# Patient Record
Sex: Female | Born: 1965 | Hispanic: Yes | Marital: Single | Smoking: Never smoker
Health system: Southern US, Community
[De-identification: ages and names within clinical notes are randomized; demographics above are authoritative.]

## PROBLEM LIST (undated history)

## (undated) DIAGNOSIS — I10 Essential (primary) hypertension: Secondary | ICD-10-CM

## (undated) HISTORY — PX: TUBAL LIGATION: SHX77

---

## 2006-07-07 ENCOUNTER — Ambulatory Visit: Payer: Self-pay | Admitting: Family Medicine

## 2006-07-08 ENCOUNTER — Ambulatory Visit: Payer: Self-pay | Admitting: *Deleted

## 2006-09-22 ENCOUNTER — Ambulatory Visit: Payer: Self-pay | Admitting: Family Medicine

## 2006-12-07 ENCOUNTER — Inpatient Hospital Stay (HOSPITAL_COMMUNITY): Admission: AD | Admit: 2006-12-07 | Discharge: 2006-12-07 | Payer: Self-pay | Admitting: Gynecology

## 2006-12-09 ENCOUNTER — Ambulatory Visit: Payer: Self-pay | Admitting: Family Medicine

## 2006-12-30 ENCOUNTER — Ambulatory Visit: Payer: Self-pay | Admitting: Family Medicine

## 2007-01-22 ENCOUNTER — Encounter (INDEPENDENT_AMBULATORY_CARE_PROVIDER_SITE_OTHER): Payer: Self-pay | Admitting: Family Medicine

## 2007-01-22 ENCOUNTER — Ambulatory Visit: Payer: Self-pay | Admitting: Family Medicine

## 2007-05-24 ENCOUNTER — Ambulatory Visit: Payer: Self-pay | Admitting: Family Medicine

## 2007-07-14 ENCOUNTER — Ambulatory Visit (HOSPITAL_COMMUNITY): Admission: RE | Admit: 2007-07-14 | Discharge: 2007-07-14 | Payer: Self-pay | Admitting: Family Medicine

## 2007-08-11 ENCOUNTER — Encounter (INDEPENDENT_AMBULATORY_CARE_PROVIDER_SITE_OTHER): Payer: Self-pay | Admitting: *Deleted

## 2008-02-14 ENCOUNTER — Ambulatory Visit: Payer: Self-pay | Admitting: Family Medicine

## 2008-02-14 LAB — CONVERTED CEMR LAB
BUN: 11 mg/dL (ref 6–23)
CO2: 27 meq/L (ref 19–32)
Calcium: 9.5 mg/dL (ref 8.4–10.5)
Chloride: 104 meq/L (ref 96–112)
Cholesterol: 160 mg/dL (ref 0–200)
Creatinine, Ser: 0.55 mg/dL (ref 0.40–1.20)
Free T4: 1.06 ng/dL (ref 0.89–1.80)
Glucose, Bld: 127 mg/dL — ABNORMAL HIGH (ref 70–99)
HDL: 53 mg/dL (ref >39)
LDL Cholesterol: 86 mg/dL (ref 0–99)
Potassium: 4 meq/L (ref 3.5–5.3)
Sodium: 142 meq/L (ref 135–145)
TSH: 1.336 microintl units/mL (ref 0.350–5.50)
Total CHOL/HDL Ratio: 3
Triglycerides: 107 mg/dL (ref <150)
VLDL: 21 mg/dL (ref 0–40)

## 2008-02-16 ENCOUNTER — Ambulatory Visit (HOSPITAL_COMMUNITY): Admission: RE | Admit: 2008-02-16 | Discharge: 2008-02-16 | Payer: Self-pay | Admitting: Family Medicine

## 2008-03-09 ENCOUNTER — Encounter: Admission: RE | Admit: 2008-03-09 | Discharge: 2008-03-09 | Payer: Self-pay | Admitting: Family Medicine

## 2008-04-14 ENCOUNTER — Ambulatory Visit: Payer: Self-pay | Admitting: Family Medicine

## 2008-04-18 ENCOUNTER — Ambulatory Visit: Payer: Self-pay | Admitting: Internal Medicine

## 2008-04-27 ENCOUNTER — Ambulatory Visit: Payer: Self-pay | Admitting: Internal Medicine

## 2008-07-06 ENCOUNTER — Ambulatory Visit: Payer: Self-pay | Admitting: Internal Medicine

## 2008-11-10 ENCOUNTER — Ambulatory Visit: Payer: Self-pay | Admitting: Family Medicine

## 2008-11-10 LAB — CONVERTED CEMR LAB: Microalb, Ur: 0.29 mg/dL (ref 0.00–1.89)

## 2008-11-14 ENCOUNTER — Ambulatory Visit: Payer: Self-pay | Admitting: Family Medicine

## 2009-02-01 ENCOUNTER — Ambulatory Visit: Payer: Self-pay | Admitting: Internal Medicine

## 2009-03-13 ENCOUNTER — Ambulatory Visit: Payer: Self-pay | Admitting: Family Medicine

## 2009-05-31 ENCOUNTER — Ambulatory Visit: Payer: Self-pay | Admitting: Family Medicine

## 2009-08-16 ENCOUNTER — Ambulatory Visit: Payer: Self-pay | Admitting: Internal Medicine

## 2009-11-01 ENCOUNTER — Ambulatory Visit: Payer: Self-pay | Admitting: Family Medicine

## 2009-11-01 LAB — CONVERTED CEMR LAB: Microalb, Ur: 0.58 mg/dL (ref 0.00–1.89)

## 2009-11-13 ENCOUNTER — Ambulatory Visit: Payer: Self-pay | Admitting: Family Medicine

## 2009-11-13 LAB — CONVERTED CEMR LAB
ALT: 13 units/L (ref 0–35)
AST: 15 units/L (ref 0–37)
Albumin: 4.1 g/dL (ref 3.5–5.2)
Alkaline Phosphatase: 90 units/L (ref 39–117)
BUN: 14 mg/dL (ref 6–23)
CO2: 24 meq/L (ref 19–32)
Calcium: 9 mg/dL (ref 8.4–10.5)
Chloride: 105 meq/L (ref 96–112)
Cholesterol: 170 mg/dL (ref 0–200)
Creatinine, Ser: 0.54 mg/dL (ref 0.40–1.20)
Glucose, Bld: 134 mg/dL — ABNORMAL HIGH (ref 70–99)
HDL: 48 mg/dL (ref >39)
LDL Cholesterol: 100 mg/dL — ABNORMAL HIGH (ref 0–99)
Potassium: 4 meq/L (ref 3.5–5.3)
Sodium: 140 meq/L (ref 135–145)
Total Bilirubin: 0.4 mg/dL (ref 0.3–1.2)
Total CHOL/HDL Ratio: 3.5
Total Protein: 7.2 g/dL (ref 6.0–8.3)
Triglycerides: 112 mg/dL (ref <150)
VLDL: 22 mg/dL (ref 0–40)
Vit D, 25-Hydroxy: 21 ng/mL — ABNORMAL LOW (ref 30–89)

## 2009-12-27 ENCOUNTER — Ambulatory Visit: Payer: Self-pay | Admitting: Family Medicine

## 2010-01-10 ENCOUNTER — Ambulatory Visit: Payer: Self-pay | Admitting: Family Medicine

## 2010-03-01 ENCOUNTER — Ambulatory Visit: Payer: Self-pay | Admitting: Internal Medicine

## 2010-03-05 ENCOUNTER — Ambulatory Visit (HOSPITAL_COMMUNITY): Admission: RE | Admit: 2010-03-05 | Discharge: 2010-03-05 | Payer: Self-pay | Admitting: Family Medicine

## 2010-08-06 ENCOUNTER — Ambulatory Visit: Payer: Self-pay | Admitting: Internal Medicine

## 2010-08-20 ENCOUNTER — Ambulatory Visit (HOSPITAL_COMMUNITY): Admission: RE | Admit: 2010-08-20 | Discharge: 2010-08-20 | Payer: Self-pay | Admitting: Family Medicine

## 2010-12-01 ENCOUNTER — Emergency Department (HOSPITAL_COMMUNITY)
Admission: EM | Admit: 2010-12-01 | Discharge: 2010-12-01 | Payer: Self-pay | Source: Home / Self Care | Admitting: Emergency Medicine

## 2010-12-09 LAB — GLUCOSE, CAPILLARY: Glucose-Capillary: 193 mg/dL — ABNORMAL HIGH (ref 70–99)

## 2011-12-19 ENCOUNTER — Other Ambulatory Visit: Payer: Self-pay | Admitting: Family Medicine

## 2012-06-17 ENCOUNTER — Encounter (HOSPITAL_COMMUNITY): Payer: Self-pay

## 2012-06-17 ENCOUNTER — Emergency Department (HOSPITAL_COMMUNITY)
Admission: EM | Admit: 2012-06-17 | Discharge: 2012-06-17 | Disposition: A | Discharge status: Home / Self Care | Payer: Self-pay | Source: Home / Self Care

## 2012-06-17 DIAGNOSIS — L0291 Cutaneous abscess, unspecified: Secondary | ICD-10-CM

## 2012-06-17 HISTORY — DX: Essential (primary) hypertension: I10

## 2012-06-17 LAB — POCT I-STAT, CHEM 8
BUN: 12 mg/dL (ref 6–23)
Calcium, Ion: 1.24 mmol/L — ABNORMAL HIGH (ref 1.12–1.23)
Chloride: 103 mEq/L (ref 96–112)
Creatinine, Ser: 0.7 mg/dL (ref 0.50–1.10)
Glucose, Bld: 172 mg/dL — ABNORMAL HIGH (ref 70–99)
HCT: 41 % (ref 36.0–46.0)
Hemoglobin: 13.9 g/dL (ref 12.0–15.0)
Potassium: 4 mEq/L (ref 3.5–5.1)
Sodium: 142 mEq/L (ref 135–145)
TCO2: 28 mmol/L (ref 0–100)

## 2012-06-17 MED ORDER — DOXYCYCLINE HYCLATE 100 MG PO TABS: 100.0000 mg | ORAL_TABLET | Freq: Two times a day (BID) | ORAL | Status: AC

## 2012-06-17 NOTE — ED Notes (Signed)
After discharge, family member accompanying pt asked for the note to be changed from 2 days off to 10-12 days off.  Informed her we could not do that.  Pt and family member went to front desk where [employer] and Ar[employer]a Jansky witnessed them scratching through the return to work date with ink pen and re-writing a new date.

## 2012-06-17 NOTE — ED Provider Notes (Signed)
History     CSN: [id number]  Arrival date & time 06/17/12  1430   First MD Initiated Contact with Patient 06/17/12 1434      Chief Complaint  Patient presents with  . Recurrent Skin Infections   HPI  Patient presents to Urgent Care for an abscess.  Daughter interprets for patient.  Patient first noticed the abscess 6 days ago.  It started as a small boil but now has become larger, more painful, red, and swollen.  Located on skin LT lower abdomen.  Patient has a history of recurrent infections.  She does have Diabetes, Type 2 and takes Metformin 500 mg BID.  She does not check CBG at home.  Patient complains of 10/10.  She has not taken any medication for pain.  She endorses chills and subjective fevers at home.  Denies any nausea or vomiting.   Past Medical History  Diagnosis Date  . Hypertension   . Diabetes mellitus     History reviewed. No pertinent past surgical history.  No family history on file.  History  Substance Use Topics  . Smoking status: Never Smoker   . Smokeless tobacco: Not on file  . Alcohol Use: No    Review of Systems  Per HPI  Allergies  Review of patient's allergies indicates no known allergies.  Home Medications   Current Outpatient Rx  Name Route Sig Dispense Refill  . IBUPROFEN 800 MG PO TABS Oral Take 800 mg by mouth every 8 (eight) hours as needed.    Marland Kitchen LISINOPRIL 5 MG PO TABS Oral Take 5 mg by mouth 2 (two) times daily.    Marland Kitchen METFORMIN HCL 500 MG PO TABS Oral Take 500 mg by mouth 2 (two) times daily with a meal.    . THERA M PLUS PO TABS Oral Take 1 tablet by mouth daily.    Marland Kitchen PANTOPRAZOLE SODIUM 40 MG PO TBEC Oral Take 40 mg by mouth daily.      BP 127/65  Pulse 80  Temp 98.6 F (37 C) (Oral)  Resp 16  SpO2 100%  LMP 04/07/2012  Physical Exam  Constitutional: No distress.  Cardiovascular: Normal rate and normal heart sounds.   Pulmonary/Chest: Effort normal and breath sounds normal.  Skin:       Oval-shaped area of erythema  and induration on left side of lower abdomen; open center with dried pus but no active bleeding or drainage; tender on palpation; measures about 4-5 cm long, 4 cm wide.    ED Course  Procedures (including critical care time)  Labs Reviewed  POCT I-STAT, CHEM 8 - Abnormal; Notable for the following:    Glucose, Bld 172 (*)     Calcium, Ion 1.24 (*)     All other components within normal limits     MDM   For cellulitis, will treat with Doxycycline BID x 10 days.  Will clean open lesion with chlorhexidine scrub.  Patient to return to ED in 3 days if cellulitis is worsening or if she develops worsening pain, fever, chills, or nausea vomiting.  Patient to follow up with PCP for diabetes - CBG 172 today.          Barnabas Lister, MD 06/17/12 [phone]

## 2012-06-17 NOTE — ED Notes (Signed)
C/o boil to lt lower abdomen for 4 days.  States it started draining yesterday.  Has had previous boils.

## 2012-06-17 NOTE — ED Provider Notes (Signed)
Medical screening examination/treatment/procedure(s) were performed by resident-physician practitioner and as supervising physician I was immediately available for consultation/collaboration. I have examined the patient and review discharge instructions with the patient.  Jill Beck   Cleavon Goldman, MD 06/17/12 1613 

## 2012-12-21 ENCOUNTER — Encounter (HOSPITAL_COMMUNITY): Payer: Self-pay | Admitting: *Deleted

## 2012-12-21 ENCOUNTER — Emergency Department (INDEPENDENT_AMBULATORY_CARE_PROVIDER_SITE_OTHER)
Admission: EM | Admit: 2012-12-21 | Discharge: 2012-12-21 | Disposition: A | Discharge status: Home / Self Care | Payer: No Typology Code available for payment source | Source: Home / Self Care | Attending: Emergency Medicine | Admitting: Emergency Medicine

## 2012-12-21 DIAGNOSIS — S39012A Strain of muscle, fascia and tendon of lower back, initial encounter: Secondary | ICD-10-CM

## 2012-12-21 DIAGNOSIS — S335XXA Sprain of ligaments of lumbar spine, initial encounter: Secondary | ICD-10-CM

## 2012-12-21 DIAGNOSIS — I10 Essential (primary) hypertension: Secondary | ICD-10-CM

## 2012-12-21 DIAGNOSIS — E119 Type 2 diabetes mellitus without complications: Secondary | ICD-10-CM

## 2012-12-21 LAB — POCT I-STAT, CHEM 8
BUN: 19 mg/dL (ref 6–23)
Calcium, Ion: 1.2 mmol/L (ref 1.12–1.23)
Chloride: 104 mEq/L (ref 96–112)
Creatinine, Ser: 0.7 mg/dL (ref 0.50–1.10)
Glucose, Bld: 128 mg/dL — ABNORMAL HIGH (ref 70–99)
HCT: 43 % (ref 36.0–46.0)
Hemoglobin: 14.6 g/dL (ref 12.0–15.0)
Potassium: 3.9 mEq/L (ref 3.5–5.1)
Sodium: 142 mEq/L (ref 135–145)
TCO2: 31 mmol/L (ref 0–100)

## 2012-12-21 LAB — POCT URINALYSIS DIP (DEVICE)
Bilirubin Urine: NEGATIVE
Glucose, UA: NEGATIVE mg/dL
Ketones, ur: NEGATIVE mg/dL
Leukocytes, UA: NEGATIVE
Nitrite: NEGATIVE
Protein, ur: NEGATIVE mg/dL
Specific Gravity, Urine: 1.02 (ref 1.005–1.030)
Urobilinogen, UA: 0.2 mg/dL (ref 0.0–1.0)
pH: 7 (ref 5.0–8.0)

## 2012-12-21 MED ORDER — LISINOPRIL 5 MG PO TABS: 5.0000 mg | ORAL_TABLET | Freq: Two times a day (BID) | ORAL | Status: AC

## 2012-12-21 MED ORDER — METFORMIN HCL 500 MG PO TABS: 500.0000 mg | ORAL_TABLET | Freq: Two times a day (BID) | ORAL | Status: AC

## 2012-12-21 MED ORDER — METHOCARBAMOL 500 MG PO TABS: 500.0000 mg | ORAL_TABLET | Freq: Three times a day (TID) | ORAL | Status: AC

## 2012-12-21 MED ORDER — PANTOPRAZOLE SODIUM 40 MG PO TBEC: 40.0000 mg | DELAYED_RELEASE_TABLET | Freq: Every day | ORAL | Status: AC

## 2012-12-21 MED ORDER — TRAMADOL HCL 50 MG PO TABS: 100.0000 mg | ORAL_TABLET | Freq: Three times a day (TID) | ORAL | Status: AC | PRN

## 2012-12-21 MED ORDER — MELOXICAM 15 MG PO TABS: 15.0000 mg | ORAL_TABLET | Freq: Every day | ORAL | Status: AC

## 2012-12-21 NOTE — ED Provider Notes (Signed)
Chief Complaint  Patient presents with  . Medication Refill    History of Present Illness:   Jill Beck is a 47 year old female who presents today for lower back pain and refills on medications. She speaks English fairly well, but still somewhat limited, so her daughter serves as an Equities trader.  1. Low back pain: She has a three-day history of pain in her lower lumbar spine. This does not radiate into her legs and there is no numbness, tingling, or weakness. She denies any injury. She can feel some swelling and a sensation of a bump in that area. She denies any dysuria, frequency, hematuria, or incontinence of bladder or bowel. She has no saddle anesthesia, fever, chills, or unexplained weight loss.  2. Diabetes: The patient has been diabetic for about 8 years. [facility]. She's been on metformin 500 mg twice a day without medication side effects. She's not checking her blood sugars but denies any hypoglycemic episodes. No polyuria, polydipsia, or blurry vision. She denies any chest pain, tightness, pressure, shortness of breath, or extremity pain, paresthesias, swelling, or ulcerations.  3. Hypertension: She has had high blood pressure also for about 8 years. She is on lisinopril 5 mg a day without medication side effects. She denies any headache, dizziness, blurry vision.  4. Gastroesophageal reflux: She takes Protonix 40 mg daily for her reflux symptoms. These are under good control with no breakthroughs. She denies any dysphagia, nausea, vomiting, weight loss, or evidence of GI bleeding.  Review of Systems:  Other than noted above, the patient denies any of the following symptoms: Systemic:  No fever, chills, severe fatigue, or unexplained weight loss. GI:  No abdominal pain, nausea, vomiting, diarrhea, constipation, incontinence of bowel, or blood in stool. GU:  No dysuria, frequency, urgency, or hematuria. No incontinence of urine or  difficulty urinating.  M-S:  No neck pain, joint pain, arthritis, or myalgias. Neuro:  No paresthesias, saddle anesthesia, muscular weakness, or progressive neurological deficit.  PMFSH:  Past medical history, family history, social history, meds, and allergies were reviewed. Specifically, there is no history of cancer, major trauma, osteoporosis, immunosuppression, HIV, or IV or injection drug use.   Physical Exam:   Vital signs:  BP 142/82  Pulse 70  Temp 98.5 F (36.9 C) (Oral)  Resp 76  SpO2 98% General:  Alert, oriented, in no distress. Lungs: Clear to auscultation. Heart: Regular rhythm without gallop or murmur. Abdomen:  Soft, non-tender.  No organomegaly or mass.  No pulsatile midline abdominal mass or bruit. Back:  There was pain to palpation in the lower lumbar spine at the L5-S1 level. There was no swelling or mass palpated. The back had a limited range of motion with about 10 of flexion, 10 of extension, 10 of lateral bending, and 20 of rotation with pain. Straight leg raising was negative. Neuro:  Normal muscle strength, sensations and DTRs. Extremities: Pedal pulses were full, there was no edema. Skin:  Clear, warm and dry.  No rash.  Labs:   Results for orders placed during the hospital encounter of 12/21/12  POCT URINALYSIS DIP (DEVICE)      Component Value Range   Glucose, UA NEGATIVE  NEGATIVE mg/dL   Bilirubin Urine NEGATIVE  NEGATIVE   Ketones, ur NEGATIVE  NEGATIVE mg/dL   Specific Gravity, Urine 1.020  1.005 - 1.030   Hgb urine dipstick TRACE (*) NEGATIVE   pH 7.0  5.0 - 8.0   Protein, ur NEGATIVE  NEGATIVE mg/dL   Urobilinogen, UA 0.2  0.0 - 1.0 mg/dL   Nitrite NEGATIVE  NEGATIVE   Leukocytes, UA NEGATIVE  NEGATIVE  POCT I-STAT, CHEM 8      Component Value Range   Sodium 142  135 - 145 mEq/L   Potassium 3.9  3.5 - 5.1 mEq/L   Chloride 104  96 - 112 mEq/L   BUN 19  6 - 23 mg/dL   Creatinine, Ser 3.08  0.50 - 1.10 mg/dL   Glucose, Bld 657 (*) 70 -  99 mg/dL   Calcium, Ion 8.46  1.12 - 1.23 mmol/L   TCO2 31  0 - 100 mmol/L   Hemoglobin 14.6  12.0 - 15.0 g/dL   HCT 96.2  95.2 - 84.1 %    Assessment:  The primary encounter diagnosis was Lumbar strain. Diagnoses of DM type 2 (diabetes mellitus, type 2) and Hypertension were also pertinent to this visit.  Plan:   1.  The following meds were prescribed:   New Prescriptions   MELOXICAM (MOBIC) 15 MG TABLET    Take 1 tablet (15 mg total) by mouth daily.   METHOCARBAMOL (ROBAXIN) 500 MG TABLET    Take 1 tablet (500 mg total) by mouth 3 (three) times daily.   TRAMADOL (ULTRAM) 50 MG TABLET    Take 2 tablets (100 mg total) by mouth every 8 (eight) hours as needed for pain.   She was also given refills on her lisinopril 5 mg #30 one once daily with 3 additional refills, metformin 500 mg, #60, 1 twice a day with 3 additional refills, and Protonix 40 mg, #30, 1 daily with 3 refills. I suggested she followup with the adult care clinic.  2.  The patient was instructed in symptomatic care and handouts were given. 3.  The patient was told to return if becoming worse in any way, if no better in 2 weeks, and given some red flag symptoms that would indicate earlier return. 4.  The patient was encouraged to try to be as active as possible and given some exercises to do followed by moist heat.    Reuben Likes, MD 12/21/12 [phone]

## 2012-12-21 NOTE — ED Notes (Signed)
Pt  Ran  Out  Of  Her  meds      She  Also reports  Back  Pain   And  A  Bump  On   Her  Back         Symptoms   X  4  Days    She  Is  Sitting  Upright on  Exam table   Speaking in  Complete  Sentances

## 2014-01-11 ENCOUNTER — Emergency Department (INDEPENDENT_AMBULATORY_CARE_PROVIDER_SITE_OTHER)
Admission: EM | Admit: 2014-01-11 | Discharge: 2014-01-11 | Disposition: A | Discharge status: Home / Self Care | Payer: Self-pay | Source: Home / Self Care | Attending: Emergency Medicine | Admitting: Emergency Medicine

## 2014-01-11 ENCOUNTER — Encounter (HOSPITAL_COMMUNITY): Payer: Self-pay | Admitting: Emergency Medicine

## 2014-01-11 DIAGNOSIS — E119 Type 2 diabetes mellitus without complications: Secondary | ICD-10-CM

## 2014-01-11 DIAGNOSIS — Z760 Encounter for issue of repeat prescription: Secondary | ICD-10-CM

## 2014-01-11 LAB — POCT I-STAT, CHEM 8
BUN: 11 mg/dL (ref 6–23)
CALCIUM ION: 1.25 mmol/L — AB (ref 1.12–1.23)
Chloride: 98 mEq/L (ref 96–112)
Creatinine, Ser: 0.6 mg/dL (ref 0.50–1.10)
Glucose, Bld: 402 mg/dL — ABNORMAL HIGH (ref 70–99)
HCT: 46 % (ref 36.0–46.0)
Hemoglobin: 15.6 g/dL — ABNORMAL HIGH (ref 12.0–15.0)
Potassium: 4.4 mEq/L (ref 3.7–5.3)
Sodium: 139 mEq/L (ref 137–147)
TCO2: 27 mmol/L (ref 0–100)

## 2014-01-11 MED ORDER — METFORMIN HCL 500 MG PO TABS: 500.0000 mg | ORAL_TABLET | Freq: Two times a day (BID) | ORAL | Status: DC

## 2014-01-11 MED ORDER — LISINOPRIL 5 MG PO TABS: 5.0000 mg | ORAL_TABLET | Freq: Two times a day (BID) | ORAL | Status: DC

## 2014-01-11 NOTE — ED Notes (Signed)
Pt reports she is needing refills on her meds Has no PCP; has not been keeping up w/her sugars  She is alert w/no signs of acute distress.

## 2014-01-11 NOTE — ED Provider Notes (Signed)
CSN: [id number]     Arrival date & time 01/11/14  1150 History   First MD Initiated Contact with Patient 01/11/14 1250     Chief Complaint  Patient presents with  . Medication Refill  . Diabetes     (Consider location/radiation/quality/duration/timing/severity/associated sxs/prior Treatment) HPI Comments: Presents for refills for medications taken for hypertension and T2DM. Is a former [facility] patient and has let her eligibility lapse.   Patient is a 48 y.o. female presenting with diabetes problem. The history is provided by the patient.  Diabetes    Past Medical History  Diagnosis Date  . Hypertension   . Diabetes mellitus    History reviewed. No pertinent past surgical history. No family history on file. History  Substance Use Topics  . Smoking status: Never Smoker   . Smokeless tobacco: Not on file  . Alcohol Use: No   OB History   Grav Para Term Preterm Abortions TAB SAB Ect Mult Living                 Review of Systems  All other systems reviewed and are negative.      Allergies  Review of patient's allergies indicates no known allergies.  Home Medications   Current Outpatient Rx  Name  Route  Sig  Dispense  Refill  . lisinopril (PRINIVIL,ZESTRIL) 5 MG tablet   Oral   Take 1 tablet (5 mg total) by mouth 2 (two) times daily.   30 tablet   3   . metFORMIN (GLUCOPHAGE) 500 MG tablet   Oral   Take 1 tablet (500 mg total) by mouth 2 (two) times daily with a meal.   60 tablet   3   . meloxicam (MOBIC) 15 MG tablet   Oral   Take 1 tablet (15 mg total) by mouth daily.   15 tablet   0   . methocarbamol (ROBAXIN) 500 MG tablet   Oral   Take 1 tablet (500 mg total) by mouth 3 (three) times daily.   30 tablet   0   . Multiple Vitamins-Minerals (MULTIVITAMINS THER. W/MINERALS) TABS   Oral   Take 1 tablet by mouth daily.         . pantoprazole (PROTONIX) 40 MG tablet   Oral   Take 1 tablet (40 mg total) by mouth daily.   30  tablet   3   . traMADol (ULTRAM) 50 MG tablet   Oral   Take 2 tablets (100 mg total) by mouth every 8 (eight) hours as needed for pain.   30 tablet   0    BP 138/74  Pulse 74  Temp(Src) 98.5 F (36.9 C) (Oral)  Resp 20  SpO2 95% Physical Exam  Nursing note and vitals reviewed. Constitutional: She is oriented to person, place, and time. She appears well-developed and well-nourished. No distress.  +morbidly obese  HENT:  Head: Normocephalic and atraumatic.  Right Ear: External ear normal.  Left Ear: External ear normal.  Nose: Nose normal.  Eyes: Conjunctivae are normal. No scleral icterus.  Neck: Normal range of motion. Neck supple.  Cardiovascular: Normal rate, regular rhythm and normal heart sounds.   Pulmonary/Chest: Effort normal and breath sounds normal. No respiratory distress.  Abdominal: Soft. Bowel sounds are normal. She exhibits no distension. There is no tenderness.  Musculoskeletal: Normal range of motion. She exhibits no edema and no tenderness.  Neurological: She is alert and oriented to person, place, and time.  Skin: Skin is warm and  dry. No rash noted.  Psychiatric: She has a normal mood and affect. Her behavior is normal.    ED Course  Procedures (including critical care time) Labs Review Labs Reviewed - No data to display Imaging Review No results found.    MDM   Final diagnoses:  None  Provided patient with resource information for [facility] as well as for [facility]. Stressed to her the importance of re-establishing with a primary care provider and advised the prescriptions from [facility] today would be in limited amounts.     Jess Barters [place], [place] 01/11/14 1353

## 2014-01-11 NOTE — Discharge Instructions (Signed)
Entrega de recetas - Departamento de Emergencias  (Medication Refill, Emergency Department)  Hoy le hemos entregado una receta como cortesía. Para su mejor atención, deberá concurrir al consultorio de su médico de cabecera para obtener nuevas recetas. Ellos tienen los registros y pueden hacer un mejor seguimiento que en el departamento de emergencias.  Aquí sólo se prescribe la cantidad necesaria hasta que consulte a su médico. Este es un modo más costoso.  En el futuro planifique las consultas de modo que no tenga que concurrir al departamento de emergencias por este motivo.  Gracias por su colaboración. Su ayuda nos permite una mejor atención de las emergencias que ingresan a diario a nuestro departamento-  Document Released: 02/17/2008 Document Revised: 02/02/2012  ExitCare® Patient Information ©2014 [employer].

## 2014-01-12 NOTE — ED Provider Notes (Signed)
Medical screening examination/treatment/procedure(s) were performed by non-physician practitioner and as supervising physician I was immediately available for consultation/collaboration.  Leslee Homeavid Orvin Netter, M.D.  Reuben Likesavid C Zeriyah Wain, MD 01/12/14 [phone]

## 2016-12-25 ENCOUNTER — Ambulatory Visit: Payer: Self-pay | Admitting: Osteopathic Medicine

## 2017-01-02 ENCOUNTER — Ambulatory Visit: Payer: Self-pay | Admitting: Physician Assistant

## 2017-01-05 ENCOUNTER — Ambulatory Visit: Payer: Self-pay | Admitting: Osteopathic Medicine

## 2018-07-28 ENCOUNTER — Encounter (HOSPITAL_COMMUNITY): Payer: Self-pay

## 2019-02-01 ENCOUNTER — Other Ambulatory Visit (HOSPITAL_COMMUNITY): Payer: Self-pay | Admitting: *Deleted

## 2019-02-01 DIAGNOSIS — Z1231 Encounter for screening mammogram for malignant neoplasm of breast: Secondary | ICD-10-CM

## 2019-05-09 ENCOUNTER — Encounter (HOSPITAL_COMMUNITY): Payer: Self-pay | Admitting: *Deleted

## 2019-05-12 ENCOUNTER — Other Ambulatory Visit: Payer: Self-pay

## 2019-05-12 ENCOUNTER — Encounter (HOSPITAL_COMMUNITY): Payer: Self-pay

## 2019-05-12 ENCOUNTER — Ambulatory Visit
Admission: RE | Admit: 2019-05-12 | Discharge: 2019-05-12 | Disposition: A | Discharge status: Home / Self Care | Payer: No Typology Code available for payment source | Source: Ambulatory Visit | Attending: Obstetrics and Gynecology | Admitting: Obstetrics and Gynecology

## 2019-05-12 ENCOUNTER — Ambulatory Visit (HOSPITAL_COMMUNITY)
Admission: RE | Admit: 2019-05-12 | Discharge: 2019-05-12 | Disposition: A | Discharge status: Home / Self Care | Payer: Self-pay | Source: Ambulatory Visit | Attending: Obstetrics and Gynecology | Admitting: Obstetrics and Gynecology

## 2019-05-12 DIAGNOSIS — Z01419 Encounter for gynecological examination (general) (routine) without abnormal findings: Secondary | ICD-10-CM

## 2019-05-12 DIAGNOSIS — Z1231 Encounter for screening mammogram for malignant neoplasm of breast: Secondary | ICD-10-CM

## 2019-05-12 NOTE — Progress Notes (Signed)
No complaints today.   Pap Smear: Pap smear completed today. Last Pap smear was 07/28/2018 at Dr. Ulyses Amor office and [facility]. Per patient her last Pap smear is the only abnormal Pap smear she has had and did not have any follow-up completed. Last Pap smear result is in Epic.  Physical exam: Breasts Breasts symmetrical. No skin abnormalities bilateral breasts. No nipple retraction bilateral breasts. No nipple discharge bilateral breasts. No lymphadenopathy. No lumps palpated bilateral breasts. No complaints of pain or tenderness on exam. Referred patient to the [facility] for a screening mammogram. Appointment scheduled for Thursday, May 12, 2019 at 0910.        Pelvic/Bimanual   Ext Genitalia No lesions, no swelling and no discharge observed on external genitalia.         Vagina Vagina pink and normal texture. No lesions or discharge observed in vagina.          Cervix Cervix is present. Cervix pink and of normal texture. No discharge observed.     Uterus Uterus is present and palpable. Uterus in normal position and normal size.        Adnexae Bilateral ovaries present and palpable. No tenderness on palpation.         Rectovaginal No rectal exam completed today since patient had no rectal complaints. No skin abnormalities observed on exam.    Smoking History: Patient has never smoked.  Patient Navigation: Patient education provided. Access to services provided for patient through [facility] program. Spanish interpreter provided.   Colorectal Cancer Screening: Per patient has never had a colonoscopy completed. No complaints today.  Breast and Cervical Cancer Risk Assessment: Patient has no family history of breast cancer, known genetic mutations, or radiation treatment to the chest before age 35. Patient has no history of cervical dysplasia, immunocompromised, or DES exposure in-utero.  Risk Assessment    Risk Scores      05/12/2019   Last edited by: Armond Hang, LPN   5-year risk: 0.6 %   Lifetime risk: 4.3 %         Used Spanish interpreter Cresenciano Genre from [place].

## 2019-05-12 NOTE — Patient Instructions (Signed)
Explained breast self awareness with Jill Beck. Let patient know the follow-up for today's Pap smear will be based on the result. Referred patient to the [facility] for a screening mammogram. Appointment scheduled for Thursday, May 12, 2019 at 0910. Patient aware of appointment and will be there. Let patient know will follow up with her within the next couple weeks with results of Pap smear by letter or phone. Informed patient that the [facility] will follow-up with her within the next couple of weeks with results of mammogram by letter or phone. Jill Beck verbalized understanding.  Khyrie Masi, Arvil Chaco, RN 9:08 AM

## 2019-05-17 LAB — CYTOLOGY - PAP
Adequacy: ABSENT
Diagnosis: NEGATIVE
HPV: NOT DETECTED

## 2019-06-30 ENCOUNTER — Telehealth (HOSPITAL_COMMUNITY): Payer: Self-pay | Admitting: *Deleted

## 2019-06-30 NOTE — Telephone Encounter (Signed)
Called patient with Spanish interpreter Jill Beck from [facility]. Let patient know that her Pap smear was normal and HPV negative. Let patient know that her next Pap smear is due in one year due to her last Pap smear was abnormal. Patient verbalized understanding.

## 2019-08-14 IMAGING — MG DIGITAL SCREENING BILATERAL MAMMOGRAM WITH TOMO AND CAD
8 series · 8 of 24 positions shown · non-contrast
Comparison: Previous exam(s).

CLINICAL DATA: Screening.

EXAM:
DIGITAL SCREENING BILATERAL MAMMOGRAM WITH TOMO AND CAD

[R MLO synth-2D]
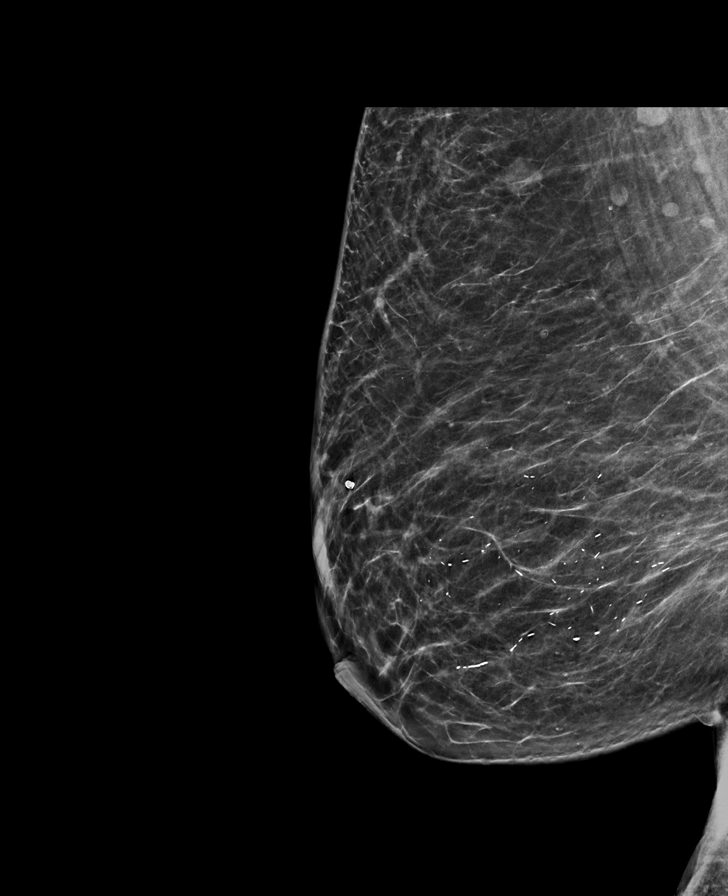

[L CC synth-2D]
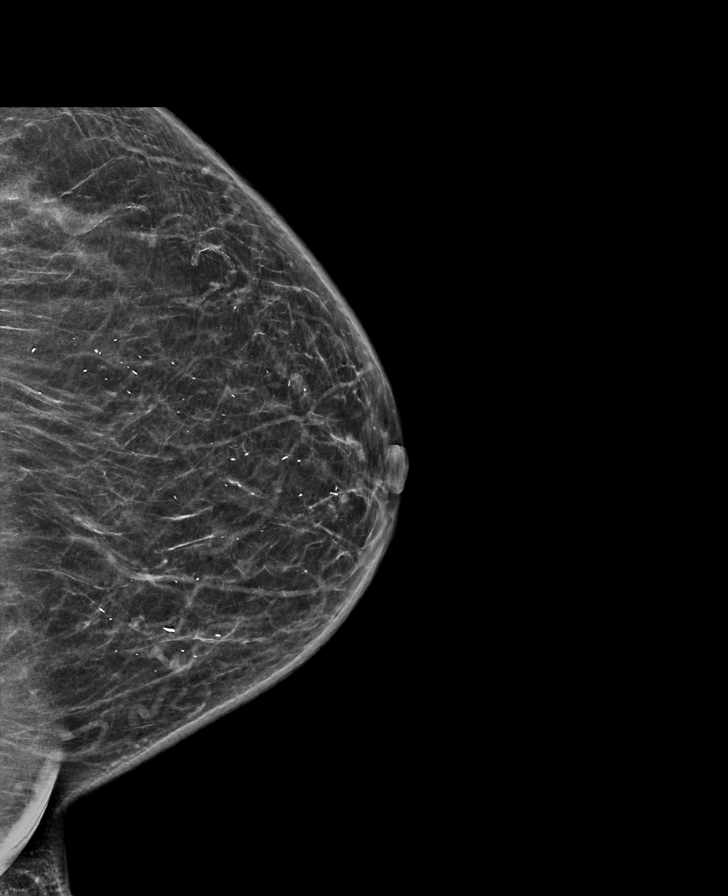

[R CC synth-2D]
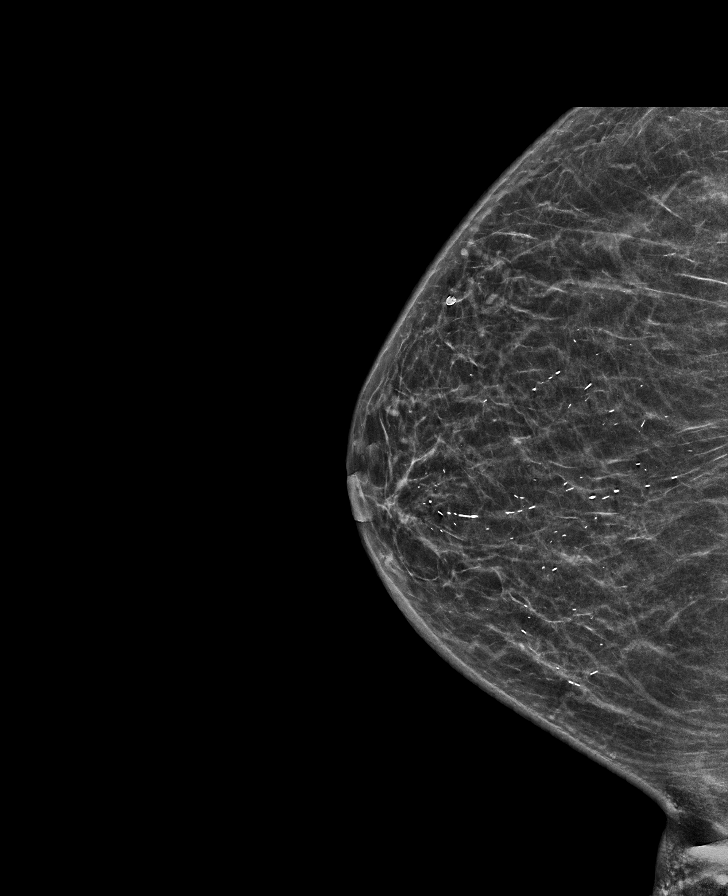

[L MLO synth-2D]
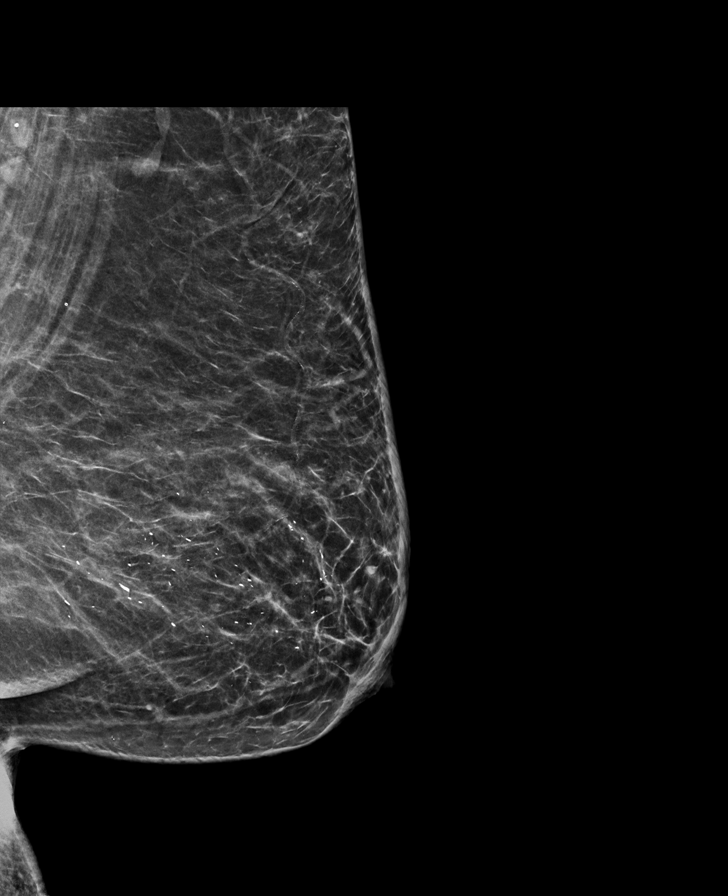

[R CC tomo · tomo slice 31/61.0]
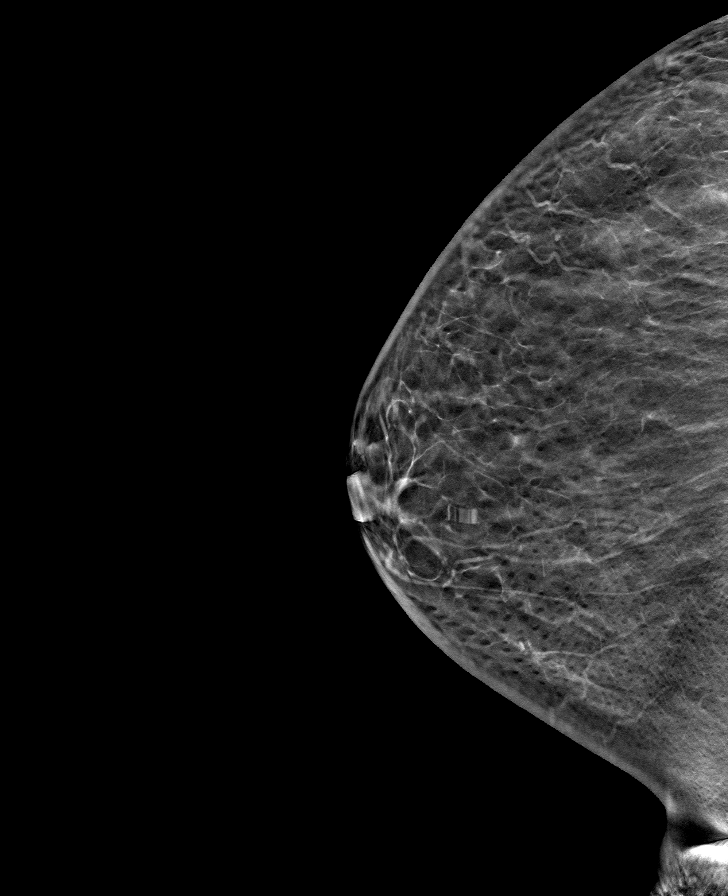

[R MLO tomo · tomo slice 37/72.0]
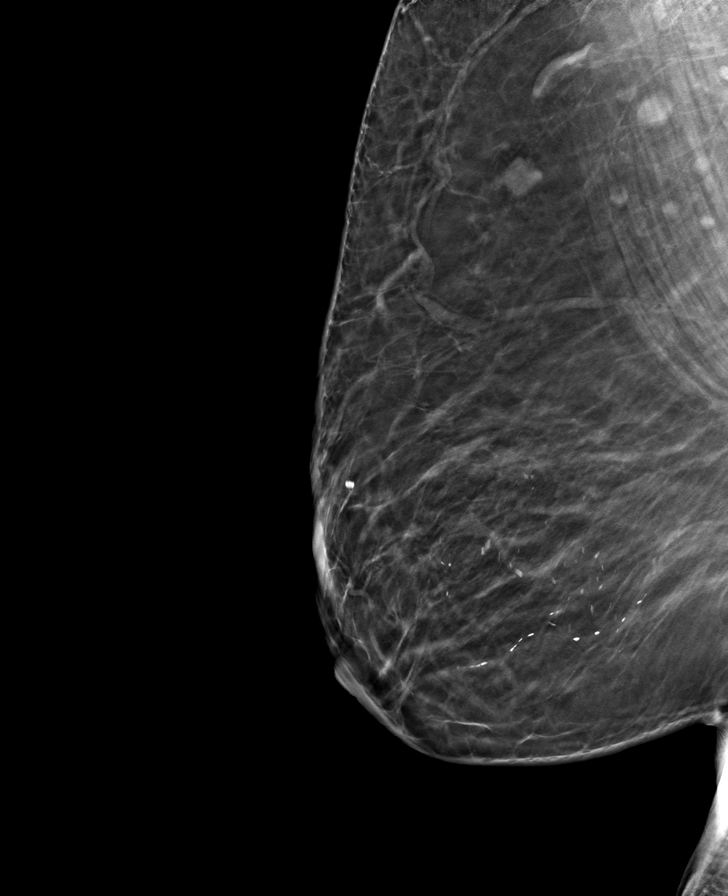

[L MLO tomo · tomo slice 35/69.0]
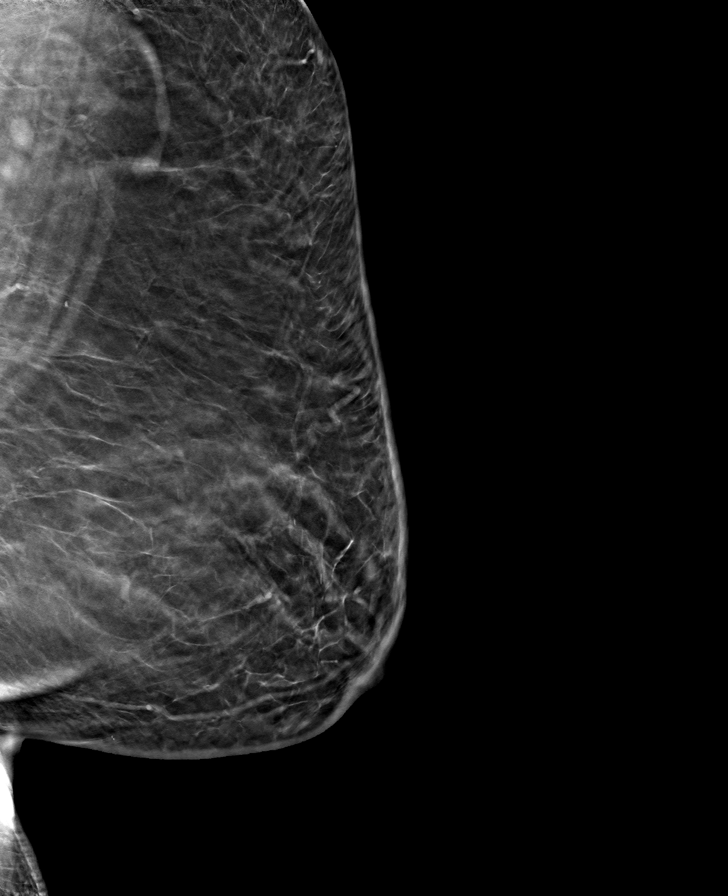

[L CC tomo · tomo slice 32/63.0]
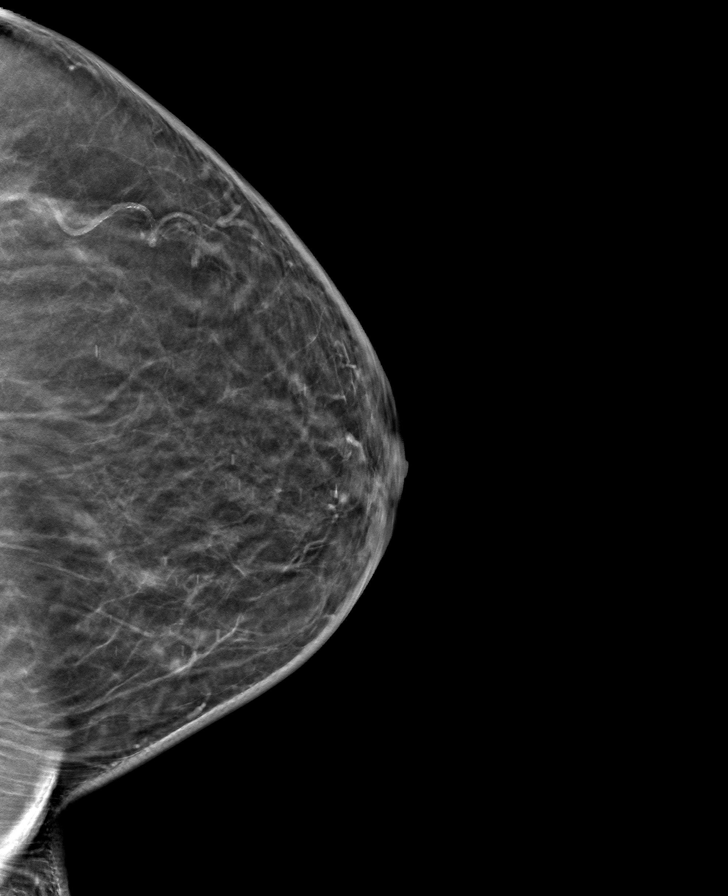

[8 of 24 positions shown; findings below may reference images not displayed]

ACR Breast Density Category b: There are scattered areas of
fibroglandular density.
FINDINGS: There are no findings suspicious for malignancy. Images were
processed with CAD.
IMPRESSION: No mammographic evidence of malignancy. A result letter of this
screening mammogram will be mailed directly to the patient.

RECOMMENDATION:
Screening mammogram in one year. (Code:CN-U-775)

BI-RADS CATEGORY  1: Negative.

## 2023-01-26 ENCOUNTER — Other Ambulatory Visit: Payer: Self-pay | Admitting: Obstetrics and Gynecology

## 2023-01-26 DIAGNOSIS — Z1231 Encounter for screening mammogram for malignant neoplasm of breast: Secondary | ICD-10-CM

## 2023-02-10 ENCOUNTER — Ambulatory Visit: Payer: Self-pay | Admitting: *Deleted

## 2023-02-10 ENCOUNTER — Ambulatory Visit
Admission: RE | Admit: 2023-02-10 | Discharge: 2023-02-10 | Disposition: A | Discharge status: Home / Self Care | Payer: No Typology Code available for payment source | Source: Ambulatory Visit | Attending: Obstetrics and Gynecology | Admitting: Obstetrics and Gynecology

## 2023-02-10 VITALS — BP 150/72 | Wt 189.0 lb

## 2023-02-10 DIAGNOSIS — Z01419 Encounter for gynecological examination (general) (routine) without abnormal findings: Secondary | ICD-10-CM

## 2023-02-10 DIAGNOSIS — Z1231 Encounter for screening mammogram for malignant neoplasm of breast: Secondary | ICD-10-CM

## 2023-02-10 NOTE — Patient Instructions (Signed)
Explained breast self awareness with Jill Beck. Pap smear completed today. Let her know that if today's Pap smear is normal and HPV negative that her next Pap smear will be due in one year due to her history of an abnormal Pap smear. Referred patient to the [facility] for a screening mammogram on mobile unit. Appointment scheduled Tuesday, February 10, 2023 at 1020. Patient aware of appointment and will be there. Let patient know the Breast Center will follow up with her within the next couple weeks with results of her mammogram by letter or phone. Rand Beck verbalized understanding.  Christhoper Busbee, Arvil Chaco, RN 10:14 AM

## 2023-02-10 NOTE — Progress Notes (Signed)
Ms. Jill Beck is a 57 y.o. [phone] female who presents to [facility] clinic today with no complaints.    Pap Smear: Pap smear completed today. Last Pap smear was 05/12/2019 at [facility] and was normal with negative HPV. Patient has history of an abnormal Pap smear 08/07/2018 that was [facility] with positive HPV that per patient a repeat Pap smear was completed for follow up. Last Pap smear result is available in Epic.   Physical exam: Breasts Left breast slightly larger than right breast that per patient is normal for her. No skin abnormalities bilateral breasts. No nipple retraction bilateral breasts. No nipple discharge bilateral breasts. No lymphadenopathy. No lumps palpated bilateral breasts. No complaints of pain or tenderness on exam.     MS DIGITAL SCREENING TOMO BILATERAL  Result Date: 05/12/2019 CLINICAL DATA:  Screening. EXAM: DIGITAL SCREENING BILATERAL MAMMOGRAM WITH TOMO AND CAD COMPARISON:  Previous exam(s). ACR Breast Density Category b: There are scattered areas of fibroglandular density. FINDINGS: There are no findings suspicious for malignancy. Images were processed with CAD. IMPRESSION: No mammographic evidence of malignancy. A result letter of this screening mammogram will be mailed directly to the patient. RECOMMENDATION: Screening mammogram in one year. (Code:SM-B-01Y) BI-RADS CATEGORY  1: Negative. Electronically Signed   By: Marin Olp M.D.   On: 05/12/2019 11:38     Pelvic/Bimanual Ext Genitalia No lesions, no swelling and no discharge observed on external genitalia.        Vagina Vagina pink and normal texture. No lesions or discharge observed in vagina.        Cervix Cervix is present. Cervix pink and of normal texture. No discharge observed.    Uterus Uterus is present and palpable. Uterus in normal position and normal size.        Adnexae Bilateral ovaries present and palpable. No tenderness on palpation.         Rectovaginal No rectal exam completed  today since patient had no rectal complaints. No skin abnormalities observed on exam.     Smoking History: Patient has never smoked.    Patient Navigation: Patient education provided. Access to services provided for patient through [facility] program. Spanish interpreter Rudene Anda from [facility] provided.   Colorectal Cancer Screening: Per patient has never had a colonoscopy completed. Patient was given a FIT Test by her PCP at [facility] that she completed today and is going to put it in the mail after her appointment. No complaints today.   Breast and Cervical Cancer Risk Assessment: Patient does not have family history of breast cancer, known genetic mutations, or radiation treatment to the chest before age 38. Patient does not have history of cervical dysplasia, immunocompromised, or DES exposure in-utero.  Risk Assessment   No risk assessment data for the current encounter  Risk Scores       05/12/2019   Last edited by: Armond Hang, LPN   5-year risk: 0.6 %   Lifetime risk: 4.3 %            A: [facility] exam with pap smear No complaints.  P: Referred patient to the [facility] for a screening mammogram on mobile unit. Appointment scheduled Tuesday, February 10, 2023 at 1020.  Loletta Parish, RN 02/10/2023 10:14 AM

## 2023-02-16 LAB — CYTOLOGY - PAP
Adequacy: ABNORMAL
Comment: NEGATIVE

## 2023-02-18 ENCOUNTER — Telehealth: Payer: Self-pay

## 2023-02-18 NOTE — Telephone Encounter (Signed)
Using [facility] Interpreter, Rudene Anda, pt has been advised of the need to repeat her PAP. Pt understands her results were non-diagnostic, therefore the recommendation is for the pt to repeat her PAP. Pt has been scheduled for 03/18/23.

## 2023-03-18 ENCOUNTER — Other Ambulatory Visit: Payer: Self-pay | Admitting: Hematology and Oncology

## 2023-03-18 DIAGNOSIS — Z124 Encounter for screening for malignant neoplasm of cervix: Secondary | ICD-10-CM

## 2023-03-18 NOTE — Progress Notes (Signed)
Patient: Jill Beck           Date of Birth: 1966/05/20           MRN: [id number] Visit Date: 03/18/2023 PCP: Patient, No Pcp Per     Cervical Exam Pap smear completed: Pap test Abnormal Observations: Normal exam. Recommendations: Repeat Pap in 5 years if normal and negative HPV.      Patient's History Patient Active Problem List   Diagnosis Date Noted   Well woman exam with routine gynecological exam 05/12/2019   Past Medical History:  Diagnosis Date   Diabetes mellitus    Hypertension     Family History  Problem Relation Age of Onset   Diabetes Brother     Social History   Occupational History   Not on file  Tobacco Use   Smoking status: Never   Smokeless tobacco: Never  Vaping Use   Vaping Use: Never used  Substance and Sexual Activity   Alcohol use: No   Drug use: No   Sexual activity: Not on file

## 2023-03-24 LAB — CYTOLOGY - PAP
Comment: NEGATIVE
Diagnosis: NEGATIVE
Diagnosis: REACTIVE
High risk HPV: NEGATIVE

## 2024-02-23 ENCOUNTER — Other Ambulatory Visit: Payer: Self-pay | Admitting: Obstetrics and Gynecology

## 2024-02-23 DIAGNOSIS — Z1231 Encounter for screening mammogram for malignant neoplasm of breast: Secondary | ICD-10-CM

## 2024-04-21 ENCOUNTER — Inpatient Hospital Stay: Admission: RE | Admit: 2024-04-21 | Source: Ambulatory Visit

## 2024-04-21 ENCOUNTER — Ambulatory Visit: Payer: Self-pay
